# Patient Record
Sex: Male | Born: 1945 | Race: White | Hispanic: No | Marital: Married | Smoking: Former smoker
Health system: Southern US, Community
[De-identification: ages and names within clinical notes are randomized; demographics above are authoritative.]

## PROBLEM LIST (undated history)

## (undated) DIAGNOSIS — R0600 Dyspnea, unspecified: Secondary | ICD-10-CM

## (undated) DIAGNOSIS — I10 Essential (primary) hypertension: Secondary | ICD-10-CM

## (undated) HISTORY — PX: TOTAL KNEE ARTHROPLASTY: SHX125

## (undated) HISTORY — PX: HERNIA REPAIR: SHX51

## (undated) HISTORY — PX: LUMBAR DISC SURGERY: SHX700

## (undated) HISTORY — PX: TOTAL SHOULDER ARTHROPLASTY: SHX126

## (undated) HISTORY — PX: ANKLE FRACTURE SURGERY: SHX122

---

## 2019-07-12 ENCOUNTER — Other Ambulatory Visit: Payer: Self-pay | Admitting: Orthopedic Surgery

## 2019-07-12 DIAGNOSIS — M25511 Pain in right shoulder: Secondary | ICD-10-CM

## 2019-07-20 ENCOUNTER — Other Ambulatory Visit: Payer: Self-pay | Admitting: Orthopedic Surgery

## 2019-07-20 DIAGNOSIS — M25511 Pain in right shoulder: Secondary | ICD-10-CM

## 2019-07-27 ENCOUNTER — Ambulatory Visit
Admission: RE | Admit: 2019-07-27 | Discharge: 2019-07-27 | Disposition: A | Discharge status: Home / Self Care | Payer: Medicare Other | Source: Ambulatory Visit | Attending: Orthopedic Surgery | Admitting: Orthopedic Surgery

## 2019-07-27 DIAGNOSIS — M25511 Pain in right shoulder: Secondary | ICD-10-CM

## 2019-08-03 NOTE — Patient Instructions (Addendum)
DUE TO COVID-19 ONLY ONE VISITOR IS ALLOWED TO COME WITH YOU AND STAY IN THE WAITING ROOM ONLY DURING PRE OP AND PROCEDURE DAY OF SURGERY. THE 1 VISITOR MAY VISIT WITH YOU AFTER SURGERY IN YOUR PRIVATE ROOM DURING VISITING HOURS ONLY!  YOU NEED TO HAVE A COVID 19 TEST ON: 08/09/19 @ 2:30 pm , THIS TEST MUST BE DONE BEFORE SURGERY, COME  [address], [place] , [address].  [facility]) ONCE YOUR COVID TEST IS COMPLETED, PLEASE BEGIN THE QUARANTINE INSTRUCTIONS AS OUTLINED IN YOUR HANDOUT.                Chad Henry   Your procedure is scheduled on: 08/12/19   Report to [facility] Main  Entrance   Report to admitting at: 10:00 AM     Call this number if you have problems the morning of surgery [phone]    Remember:   BRUSH YOUR TEETH MORNING OF SURGERY AND RINSE YOUR MOUTH OUT, NO CHEWING GUM CANDY OR MINTS.     Take these medicines the morning of surgery with A SIP OF WATER: AMLODIPINE,FAMOTIDINE,GABAPENTIN,METOPROLOL.                                 You may not have any metal on your body including hair pins and              piercings  Do not wear jewelry, lotions, powders or perfumes, deodorant             Men may shave face and neck.   Do not bring valuables to the hospital. [facility] IS NOT             RESPONSIBLE   FOR VALUABLES.  Contacts, dentures or bridgework may not be worn into surgery.  Leave suitcase in the car. After surgery it may be brought to your room.     Patients discharged the day of surgery will not be allowed to drive home. IF YOU ARE HAVING SURGERY AND GOING HOME THE SAME DAY, YOU MUST HAVE AN ADULT TO DRIVE YOU HOME AND BE WITH YOU FOR 24 HOURS. YOU MAY GO HOME BY TAXI OR UBER OR ORTHERWISE, BUT AN ADULT MUST ACCOMPANY YOU HOME AND STAY WITH YOU FOR 24 HOURS.  Name and phone number of your driver:  Special Instructions: N/A              Please read over the following fact sheets you were  given: _____________________________________________________________________   NO SOLID FOOD AFTER MIDNIGHT THE NIGHT PRIOR TO SURGERY. NOTHING BY MOUTH EXCEPT CLEAR LIQUIDS UNTIL: 9:00 AM . PLEASE FINISH ENSURE DRINK PER SURGEON ORDER  WHICH NEEDS TO BE COMPLETED AT: 9:00 AM .   CLEAR LIQUID DIET   Foods Allowed                                                                     Foods Excluded  Coffee and tea, regular and decaf                             liquids that you  cannot  Plain Jell-O any favor except red or purple                                           see through such as: Fruit ices (not with fruit pulp)                                     milk, soups, orange juice  Iced Popsicles                                    All solid food Carbonated beverages, regular and diet                                    Cranberry, grape and apple juices Sports drinks like Gatorade Lightly seasoned clear broth or consume(fat free) Sugar, honey syrup  Sample Menu Breakfast                                Lunch                                     Supper Cranberry juice                    Beef broth                            Chicken broth Jell-O                                     Grape juice                           Apple juice Coffee or tea                        Jell-O                                      Popsicle                                                Coffee or tea                        Coffee or tea  _____________________________________________________________________  [facility] - Preparing for Surgery Before surgery, you can play an important role.  Because skin is not sterile, your skin needs to be as free of germs as possible.  You can reduce the number of germs on your skin by washing with CHG (chlorahexidine gluconate) soap before surgery.  CHG is an antiseptic cleaner which kills germs and bonds  with the skin to continue killing germs even after washing. Please DO NOT  use if you have an allergy to CHG or antibacterial soaps.  If your skin becomes reddened/irritated stop using the CHG and inform your nurse when you arrive at Short Stay. Do not shave (including legs and underarms) for at least 48 hours prior to the first CHG shower.  You may shave your face/neck. Please follow these instructions carefully:  1.  Shower with CHG Soap the night before surgery and the  morning of Surgery.  2.  If you choose to wash your hair, wash your hair first as usual with your  normal  shampoo.  3.  After you shampoo, rinse your hair and body thoroughly to remove the  shampoo.                           4.  Use CHG as you would any other liquid soap.  You can apply chg directly  to the skin and wash                       Gently with a scrungie or clean washcloth.  5.  Apply the CHG Soap to your body ONLY FROM THE NECK DOWN.   Do not use on face/ open                           Wound or open sores. Avoid contact with eyes, ears mouth and genitals (private parts).                       Wash face,  Genitals (private parts) with your normal soap.             6.  Wash thoroughly, paying special attention to the area where your surgery  will be performed.  7.  Thoroughly rinse your body with warm water from the neck down.  8.  DO NOT shower/wash with your normal soap after using and rinsing off  the CHG Soap.                9.  Pat yourself dry with a clean towel.            10.  Wear clean pajamas.            11.  Place clean sheets on your bed the night of your first shower and do not  sleep with pets. Day of Surgery : Do not apply any lotions/deodorants the morning of surgery.  Please wear clean clothes to the hospital/surgery center.  FAILURE TO FOLLOW THESE INSTRUCTIONS MAY RESULT IN THE CANCELLATION OF YOUR SURGERY PATIENT SIGNATURE_________________________________  NURSE  SIGNATURE__________________________________  ________________________________________________________________________   Chad Henry  An incentive spirometer is a tool that can help keep your lungs clear and active. This tool measures how well you are filling your lungs with each breath. Taking long deep breaths may help reverse or decrease the chance of developing breathing (pulmonary) problems (especially infection) following:  A long period of time when you are unable to move or be active. BEFORE THE PROCEDURE   If the spirometer includes an indicator to show your best effort, your nurse or respiratory therapist will set it to a desired goal.  If possible, sit up straight or lean slightly forward. Try not to slouch.  Hold the incentive spirometer in an upright position. INSTRUCTIONS  FOR USE  1. Sit on the edge of your bed if possible, or sit up as far as you can in bed or on a chair. 2. Hold the incentive spirometer in an upright position. 3. Breathe out normally. 4. Place the mouthpiece in your mouth and seal your lips tightly around it. 5. Breathe in slowly and as deeply as possible, raising the piston or the ball toward the top of the column. 6. Hold your breath for 3-5 seconds or for as long as possible. Allow the piston or ball to fall to the bottom of the column. 7. Remove the mouthpiece from your mouth and breathe out normally. 8. Rest for a few seconds and repeat Steps 1 through 7 at least 10 times every 1-2 hours when you are awake. Take your time and take a few normal breaths between deep breaths. 9. The spirometer may include an indicator to show your best effort. Use the indicator as a goal to work toward during each repetition. 10. After each set of 10 deep breaths, practice coughing to be sure your lungs are clear. If you have an incision (the cut made at the time of surgery), support your incision when coughing by placing a pillow or rolled up towels firmly  against it. Once you are able to get out of bed, walk around indoors and cough well. You may stop using the incentive spirometer when instructed by your caregiver.  RISKS AND COMPLICATIONS  Take your time so you do not get dizzy or light-headed.  If you are in pain, you may need to take or ask for pain medication before doing incentive spirometry. It is harder to take a deep breath if you are having pain. AFTER USE  Rest and breathe slowly and easily.  It can be helpful to keep track of a log of your progress. Your caregiver can provide you with a simple table to help with this. If you are using the spirometer at home, follow these instructions: [place] IF:   You are having difficultly using the spirometer.  You have trouble using the spirometer as often as instructed.  Your pain medication is not giving enough relief while using the spirometer.  You develop fever of 100.5 F (38.1 C) or higher. SEEK IMMEDIATE MEDICAL CARE IF:   You cough up bloody sputum that had not been present before.  You develop fever of 102 F (38.9 C) or greater.  You develop worsening pain at or near the incision site. MAKE SURE YOU:   Understand these instructions.  Will watch your condition.  Will get help right away if you are not doing well or get worse. Document Released: 08/19/2006 Document Revised: 07/01/2011 Document Reviewed: 10/20/2006 [facility] Patient Information 2014 [place], [place].   ________________________________________________________________________

## 2019-08-04 ENCOUNTER — Other Ambulatory Visit: Payer: Self-pay

## 2019-08-04 ENCOUNTER — Other Ambulatory Visit (HOSPITAL_COMMUNITY): Payer: Self-pay

## 2019-08-04 ENCOUNTER — Encounter (HOSPITAL_COMMUNITY): Payer: Self-pay

## 2019-08-04 ENCOUNTER — Encounter (HOSPITAL_COMMUNITY)
Admission: RE | Admit: 2019-08-04 | Discharge: 2019-08-04 | Disposition: A | Discharge status: Home / Self Care | Payer: Medicare Other | Source: Ambulatory Visit | Attending: Orthopedic Surgery | Admitting: Orthopedic Surgery

## 2019-08-04 DIAGNOSIS — Z01812 Encounter for preprocedural laboratory examination: Secondary | ICD-10-CM | POA: Diagnosis not present

## 2019-08-04 HISTORY — DX: Dyspnea, unspecified: R06.00

## 2019-08-04 HISTORY — DX: Essential (primary) hypertension: I10

## 2019-08-04 NOTE — Progress Notes (Signed)
PCP - Dr. Jonelle Sports. LOV: 2/20221 Cardiologist - Dr. Hyacinth Meeker. LOV: 05/2019  Chest x-ray -  EKG -  Stress Test -  ECHO -  Cardiac Cath -   Sleep Study - yes CPAP - yes  Fasting Blood Sugar -  Checks Blood Sugar _____ times a day  Blood Thinner Instructions: Aspirin Instructions: Last Dose:  Anesthesia review:   Patient denies shortness of breath, fever, cough and chest pain at PAT appointment   Patient verbalized understanding of instructions that were given to them at the PAT appointment. Patient was also instructed that they will need to review over the PAT instructions again at home before surgery.

## 2019-08-05 ENCOUNTER — Encounter (HOSPITAL_COMMUNITY)
Admission: RE | Admit: 2019-08-05 | Discharge: 2019-08-05 | Disposition: A | Discharge status: Home / Self Care | Payer: Medicare Other | Source: Ambulatory Visit | Attending: Orthopedic Surgery | Admitting: Orthopedic Surgery

## 2019-08-05 DIAGNOSIS — Z01812 Encounter for preprocedural laboratory examination: Secondary | ICD-10-CM | POA: Diagnosis not present

## 2019-08-05 LAB — SURGICAL PCR SCREEN
MRSA, PCR: NEGATIVE
Staphylococcus aureus: NEGATIVE

## 2019-08-05 LAB — BASIC METABOLIC PANEL
Anion gap: 9 (ref 5–15)
BUN: 19 mg/dL (ref 8–23)
CO2: 27 mmol/L (ref 22–32)
Calcium: 9.2 mg/dL (ref 8.9–10.3)
Chloride: 106 mmol/L (ref 98–111)
Creatinine, Ser: 1.67 mg/dL — ABNORMAL HIGH (ref 0.61–1.24)
GFR calc Af Amer: 46 mL/min — ABNORMAL LOW (ref >60)
GFR calc non Af Amer: 40 mL/min — ABNORMAL LOW (ref >60)
Glucose, Bld: 96 mg/dL (ref 70–99)
Potassium: 4.2 mmol/L (ref 3.5–5.1)
Sodium: 142 mmol/L (ref 135–145)

## 2019-08-05 LAB — CBC
HCT: 45.2 % (ref 39.0–52.0)
Hemoglobin: 15.1 g/dL (ref 13.0–17.0)
MCH: 30.4 pg (ref 26.0–34.0)
MCHC: 33.4 g/dL (ref 30.0–36.0)
MCV: 91.1 fL (ref 80.0–100.0)
Platelets: 232 10*3/uL (ref 150–400)
RBC: 4.96 MIL/uL (ref 4.22–5.81)
RDW: 13.3 % (ref 11.5–15.5)
WBC: 8 10*3/uL (ref 4.0–10.5)
nRBC: 0 % (ref 0.0–0.2)

## 2019-08-05 NOTE — Progress Notes (Signed)
Creatinine level: 1.67

## 2019-08-09 ENCOUNTER — Other Ambulatory Visit (HOSPITAL_COMMUNITY)
Admission: RE | Admit: 2019-08-09 | Discharge: 2019-08-09 | Disposition: A | Discharge status: Home / Self Care | Payer: Medicare Other | Source: Ambulatory Visit | Attending: Orthopedic Surgery | Admitting: Orthopedic Surgery

## 2019-08-09 DIAGNOSIS — Z20822 Contact with and (suspected) exposure to covid-19: Secondary | ICD-10-CM | POA: Insufficient documentation

## 2019-08-09 DIAGNOSIS — Z01812 Encounter for preprocedural laboratory examination: Secondary | ICD-10-CM | POA: Insufficient documentation

## 2019-08-09 LAB — SARS CORONAVIRUS 2 (TAT 6-24 HRS): SARS Coronavirus 2: NEGATIVE

## 2019-08-12 ENCOUNTER — Encounter (HOSPITAL_COMMUNITY): Payer: Self-pay | Admitting: Orthopedic Surgery

## 2019-08-12 ENCOUNTER — Ambulatory Visit (HOSPITAL_COMMUNITY): Payer: Medicare Other | Admitting: Anesthesiology

## 2019-08-12 ENCOUNTER — Ambulatory Visit (HOSPITAL_COMMUNITY)
Admission: RE | Admit: 2019-08-12 | Discharge: 2019-08-12 | Disposition: A | Discharge status: Home / Self Care | Payer: Medicare Other | Source: Ambulatory Visit | Attending: Orthopedic Surgery | Admitting: Orthopedic Surgery

## 2019-08-12 ENCOUNTER — Encounter (HOSPITAL_COMMUNITY)
Admission: RE | Disposition: A | Discharge status: Home / Self Care | Payer: Self-pay | Source: Ambulatory Visit | Attending: Orthopedic Surgery

## 2019-08-12 DIAGNOSIS — Z96651 Presence of right artificial knee joint: Secondary | ICD-10-CM | POA: Insufficient documentation

## 2019-08-12 DIAGNOSIS — Z96611 Presence of right artificial shoulder joint: Secondary | ICD-10-CM

## 2019-08-12 DIAGNOSIS — T84218A Breakdown (mechanical) of internal fixation device of other bones, initial encounter: Secondary | ICD-10-CM | POA: Insufficient documentation

## 2019-08-12 DIAGNOSIS — M19011 Primary osteoarthritis, right shoulder: Secondary | ICD-10-CM | POA: Diagnosis not present

## 2019-08-12 DIAGNOSIS — Z87891 Personal history of nicotine dependence: Secondary | ICD-10-CM | POA: Diagnosis not present

## 2019-08-12 DIAGNOSIS — Z7982 Long term (current) use of aspirin: Secondary | ICD-10-CM | POA: Diagnosis not present

## 2019-08-12 DIAGNOSIS — Z79899 Other long term (current) drug therapy: Secondary | ICD-10-CM | POA: Diagnosis not present

## 2019-08-12 DIAGNOSIS — Y831 Surgical operation with implant of artificial internal device as the cause of abnormal reaction of the patient, or of later complication, without mention of misadventure at the time of the procedure: Secondary | ICD-10-CM | POA: Diagnosis not present

## 2019-08-12 DIAGNOSIS — I10 Essential (primary) hypertension: Secondary | ICD-10-CM | POA: Insufficient documentation

## 2019-08-12 HISTORY — PX: REVERSE SHOULDER ARTHROPLASTY: SHX5054

## 2019-08-12 SURGERY — ARTHROPLASTY, SHOULDER, TOTAL, REVERSE
Anesthesia: General | Site: Shoulder | Laterality: Right

## 2019-08-12 MED ORDER — 0.9 % SODIUM CHLORIDE (POUR BTL) OPTIME
TOPICAL | Status: DC | PRN
  Administered 2019-08-12: 1000 mL

## 2019-08-12 MED ORDER — MIDAZOLAM HCL 2 MG/2ML IJ SOLN
1.0000 mg | INTRAMUSCULAR | Status: DC
  Administered 2019-08-12: 1 mg via INTRAVENOUS
  Filled 2019-08-12: qty 2

## 2019-08-12 MED ORDER — BUPIVACAINE LIPOSOME 1.3 % IJ SUSP
INTRAMUSCULAR | Status: DC | PRN
  Administered 2019-08-12: 133 mg via PERINEURAL

## 2019-08-12 MED ORDER — VANCOMYCIN HCL 1000 MG IV SOLR
INTRAVENOUS | Status: DC | PRN
  Administered 2019-08-12: 1000 mg

## 2019-08-12 MED ORDER — TRANEXAMIC ACID-NACL 1000-0.7 MG/100ML-% IV SOLN
1000.0000 mg | INTRAVENOUS | Status: AC
  Administered 2019-08-12: 1000 mg via INTRAVENOUS
  Filled 2019-08-12: qty 100

## 2019-08-12 MED ORDER — LACTATED RINGERS IV SOLN: INTRAVENOUS | Status: DC

## 2019-08-12 MED ORDER — METHOCARBAMOL 500 MG PO TABS: 500.0000 mg | ORAL_TABLET | Freq: Three times a day (TID) | ORAL | 1 refills | Status: AC | PRN

## 2019-08-12 MED ORDER — VANCOMYCIN HCL 1000 MG IV SOLR
INTRAVENOUS | Status: AC
  Filled 2019-08-12: qty 1000

## 2019-08-12 MED ORDER — CEFAZOLIN SODIUM-DEXTROSE 2-4 GM/100ML-% IV SOLN
2.0000 g | INTRAVENOUS | Status: AC
  Administered 2019-08-12: 2 g via INTRAVENOUS
  Filled 2019-08-12: qty 100

## 2019-08-12 MED ORDER — ACETAMINOPHEN 500 MG PO TABS
1000.0000 mg | ORAL_TABLET | Freq: Once | ORAL | Status: DC
  Filled 2019-08-12: qty 2

## 2019-08-12 MED ORDER — ONDANSETRON HCL 4 MG/2ML IJ SOLN
INTRAMUSCULAR | Status: AC
  Filled 2019-08-12: qty 2

## 2019-08-12 MED ORDER — FENTANYL CITRATE (PF) 100 MCG/2ML IJ SOLN
50.0000 ug | INTRAMUSCULAR | Status: DC
  Administered 2019-08-12: 50 ug via INTRAVENOUS
  Filled 2019-08-12: qty 2

## 2019-08-12 MED ORDER — STERILE WATER FOR IRRIGATION IR SOLN
Status: DC | PRN
  Administered 2019-08-12: 2000 mL

## 2019-08-12 MED ORDER — ROCURONIUM BROMIDE 10 MG/ML (PF) SYRINGE
PREFILLED_SYRINGE | INTRAVENOUS | Status: DC | PRN
  Administered 2019-08-12: 10 mg via INTRAVENOUS
  Administered 2019-08-12: 50 mg via INTRAVENOUS

## 2019-08-12 MED ORDER — EPHEDRINE SULFATE-NACL 50-0.9 MG/10ML-% IV SOSY
PREFILLED_SYRINGE | INTRAVENOUS | Status: DC | PRN
  Administered 2019-08-12 (×2): 10 mg via INTRAVENOUS

## 2019-08-12 MED ORDER — BUPIVACAINE-EPINEPHRINE (PF) 0.5% -1:200000 IJ SOLN
INTRAMUSCULAR | Status: DC | PRN
  Administered 2019-08-12: 15 mL via PERINEURAL

## 2019-08-12 MED ORDER — PHENYLEPHRINE HCL (PRESSORS) 10 MG/ML IV SOLN
INTRAVENOUS | Status: AC
  Filled 2019-08-12: qty 1

## 2019-08-12 MED ORDER — GLYCOPYRROLATE PF 0.2 MG/ML IJ SOSY
PREFILLED_SYRINGE | INTRAMUSCULAR | Status: DC | PRN
  Administered 2019-08-12: .2 mg via INTRAVENOUS

## 2019-08-12 MED ORDER — PHENYLEPHRINE HCL-NACL 10-0.9 MG/250ML-% IV SOLN
INTRAVENOUS | Status: DC | PRN
  Administered 2019-08-12: 40 ug/min via INTRAVENOUS

## 2019-08-12 MED ORDER — ONDANSETRON HCL 4 MG/2ML IJ SOLN: 4.0000 mg | Freq: Once | INTRAMUSCULAR | Status: DC | PRN

## 2019-08-12 MED ORDER — LIDOCAINE 2% (20 MG/ML) 5 ML SYRINGE
INTRAMUSCULAR | Status: DC | PRN
  Administered 2019-08-12: 40 mg via INTRAVENOUS

## 2019-08-12 MED ORDER — DEXAMETHASONE SODIUM PHOSPHATE 10 MG/ML IJ SOLN
INTRAMUSCULAR | Status: DC | PRN
  Administered 2019-08-12: 4 mg via INTRAVENOUS

## 2019-08-12 MED ORDER — ACETAMINOPHEN 500 MG PO TABS
ORAL_TABLET | ORAL | Status: AC
  Filled 2019-08-12: qty 1

## 2019-08-12 MED ORDER — OXYCODONE-ACETAMINOPHEN 5-325 MG PO TABS: 1.0000 | ORAL_TABLET | ORAL | 0 refills | Status: AC | PRN

## 2019-08-12 MED ORDER — ROCURONIUM BROMIDE 10 MG/ML (PF) SYRINGE
PREFILLED_SYRINGE | INTRAVENOUS | Status: AC
  Filled 2019-08-12: qty 10

## 2019-08-12 MED ORDER — NAPROXEN 500 MG PO TABS: 500.0000 mg | ORAL_TABLET | Freq: Two times a day (BID) | ORAL | 1 refills | Status: AC

## 2019-08-12 MED ORDER — ONDANSETRON HCL 4 MG PO TABS: 4.0000 mg | ORAL_TABLET | Freq: Three times a day (TID) | ORAL | 0 refills | Status: AC | PRN

## 2019-08-12 MED ORDER — LIDOCAINE 2% (20 MG/ML) 5 ML SYRINGE
INTRAMUSCULAR | Status: AC
  Filled 2019-08-12: qty 5

## 2019-08-12 MED ORDER — ONDANSETRON HCL 4 MG/2ML IJ SOLN
INTRAMUSCULAR | Status: DC | PRN
  Administered 2019-08-12: 4 mg via INTRAVENOUS

## 2019-08-12 MED ORDER — ACETAMINOPHEN 500 MG PO TABS
1000.0000 mg | ORAL_TABLET | Freq: Once | ORAL | Status: AC
  Administered 2019-08-12: 1000 mg via ORAL

## 2019-08-12 MED ORDER — PROPOFOL 10 MG/ML IV BOLUS
INTRAVENOUS | Status: DC | PRN
  Administered 2019-08-12: 50 mg via INTRAVENOUS
  Administered 2019-08-12: 150 mg via INTRAVENOUS

## 2019-08-12 MED ORDER — PROPOFOL 10 MG/ML IV BOLUS
INTRAVENOUS | Status: AC
  Filled 2019-08-12: qty 20

## 2019-08-12 MED ORDER — SUGAMMADEX SODIUM 200 MG/2ML IV SOLN
INTRAVENOUS | Status: DC | PRN
  Administered 2019-08-12: 200 mg via INTRAVENOUS

## 2019-08-12 MED ORDER — FENTANYL CITRATE (PF) 100 MCG/2ML IJ SOLN: 25.0000 ug | INTRAMUSCULAR | Status: DC | PRN

## 2019-08-12 SURGICAL SUPPLY — 71 items
BAG ZIPLOCK 12X15 (MISCELLANEOUS) ×3 IMPLANT
BLADE SAW SGTL 83.5X18.5 (BLADE) ×3 IMPLANT
COOLER ICEMAN CLASSIC (MISCELLANEOUS) IMPLANT
COVER BACK TABLE 60X90IN (DRAPES) ×3 IMPLANT
COVER SURGICAL LIGHT HANDLE (MISCELLANEOUS) ×3 IMPLANT
COVER WAND RF STERILE (DRAPES) IMPLANT
CUP SUT UNIV REVERS 39 NEU (Shoulder) ×3 IMPLANT
DERMABOND ADVANCED (GAUZE/BANDAGES/DRESSINGS) ×2
DERMABOND ADVANCED .7 DNX12 (GAUZE/BANDAGES/DRESSINGS) ×1 IMPLANT
DRAPE INCISE IOBAN 66X45 STRL (DRAPES) IMPLANT
DRAPE ORTHO SPLIT 77X108 STRL (DRAPES) ×4
DRAPE SHEET LG 3/4 BI-LAMINATE (DRAPES) ×3 IMPLANT
DRAPE SURG 17X11 SM STRL (DRAPES) ×3 IMPLANT
DRAPE SURG ORHT 6 SPLT 77X108 (DRAPES) ×2 IMPLANT
DRAPE U-SHAPE 47X51 STRL (DRAPES) ×3 IMPLANT
DRSG AQUACEL AG ADV 3.5X 6 (GAUZE/BANDAGES/DRESSINGS) ×3 IMPLANT
DRSG AQUACEL AG ADV 3.5X10 (GAUZE/BANDAGES/DRESSINGS) ×3 IMPLANT
DURAPREP 26ML APPLICATOR (WOUND CARE) ×3 IMPLANT
ELECT BLADE TIP CTD 4 INCH (ELECTRODE) ×3 IMPLANT
ELECT REM PT RETURN 15FT ADLT (MISCELLANEOUS) ×3 IMPLANT
FACESHIELD WRAPAROUND (MASK) ×12 IMPLANT
GLENOID UNI REV MOD 24 +2 LAT (Joint) ×3 IMPLANT
GLENOSPHERE 39+4 LAT/24 UNI RV (Joint) ×3 IMPLANT
GLOVE BIO SURGEON STRL SZ7.5 (GLOVE) ×3 IMPLANT
GLOVE BIO SURGEON STRL SZ8 (GLOVE) ×3 IMPLANT
GLOVE SS BIOGEL STRL SZ 7 (GLOVE) ×2 IMPLANT
GLOVE SS BIOGEL STRL SZ 7.5 (GLOVE) ×1 IMPLANT
GLOVE SUPERSENSE BIOGEL SZ 7 (GLOVE) ×4
GLOVE SUPERSENSE BIOGEL SZ 7.5 (GLOVE) ×2
GLOVE SURG SYN 7.0 (GLOVE) IMPLANT
GLOVE SURG SYN 7.5  E (GLOVE)
GLOVE SURG SYN 7.5 E (GLOVE) IMPLANT
GLOVE SURG SYN 8.0 (GLOVE) IMPLANT
GOWN STRL REUS W/TWL LRG LVL3 (GOWN DISPOSABLE) ×6 IMPLANT
INSERT HUMERAL MED 39/ +3 (Shoulder) ×1 IMPLANT
INSERT MEDIUM HUMERAL 39/ +3 (Shoulder) ×2 IMPLANT
KIT BASIN (CUSTOM PROCEDURE TRAY) ×3 IMPLANT
KIT TURNOVER KIT A (KITS) IMPLANT
MANIFOLD NEPTUNE II (INSTRUMENTS) ×3 IMPLANT
NEEDLE TAPERED W/ NITINOL LOOP (MISCELLANEOUS) ×3 IMPLANT
NS IRRIG 1000ML POUR BTL (IV SOLUTION) ×3 IMPLANT
PACK SHOULDER (CUSTOM PROCEDURE TRAY) ×3 IMPLANT
PAD ARMBOARD 7.5X6 YLW CONV (MISCELLANEOUS) ×3 IMPLANT
PAD COLD SHLDR WRAP-ON (PAD) IMPLANT
PIN SET MODULAR GLENOID SYSTEM (PIN) ×3 IMPLANT
RESTRAINT HEAD UNIVERSAL NS (MISCELLANEOUS) ×3 IMPLANT
SCREW CENTRAL MOD 30MM (Screw) ×3 IMPLANT
SCREW PERI LOCK 5.5X16 (Screw) ×3 IMPLANT
SCREW PERI LOCK 5.5X32 (Screw) ×6 IMPLANT
SCREW PERIPHERAL 5.5X20 LOCK (Screw) ×3 IMPLANT
SLING ARM FOAM STRAP LRG (SOFTGOODS) IMPLANT
SLING ARM FOAM STRAP MED (SOFTGOODS) IMPLANT
SLING ARM IMMOBILIZER LRG (SOFTGOODS) ×3 IMPLANT
SPACER REVERSE UNI 39/ +6MM (Shoulder) ×1 IMPLANT
SPACER SHLD UNI REV 39 +6 (Shoulder) ×2 IMPLANT
SPONGE LAP 18X18 RF (DISPOSABLE) IMPLANT
STEM HUMERAL UNIVERS SZ8 (Stem) ×3 IMPLANT
SUCTION FRAZIER HANDLE 12FR (TUBING) ×2
SUCTION TUBE FRAZIER 12FR DISP (TUBING) ×1 IMPLANT
SUT FIBERWIRE #2 38 T-5 BLUE (SUTURE)
SUT MNCRL AB 3-0 PS2 18 (SUTURE) ×3 IMPLANT
SUT MON AB 2-0 CT1 36 (SUTURE) ×6 IMPLANT
SUT VIC AB 1 CT1 36 (SUTURE) ×6 IMPLANT
SUTURE FIBERWR #2 38 T-5 BLUE (SUTURE) IMPLANT
SUTURE TAPE 1.3 40 TPR END (SUTURE) ×2 IMPLANT
SUTURETAPE 1.3 40 TPR END (SUTURE) ×6
TOWEL OR 17X26 10 PK STRL BLUE (TOWEL DISPOSABLE) ×3 IMPLANT
TOWEL OR NON WOVEN STRL DISP B (DISPOSABLE) ×3 IMPLANT
WATER STERILE IRR 1000ML POUR (IV SOLUTION) ×6 IMPLANT
YANKAUER SUCT BULB TIP 10FT TU (MISCELLANEOUS) ×3 IMPLANT
YANKAUER SUCT BULB TIP NO VENT (SUCTIONS) ×3 IMPLANT

## 2019-08-12 NOTE — Anesthesia Preprocedure Evaluation (Addendum)
Anesthesia Evaluation  Patient identified by MRN, date of birth, ID band Patient awake    Reviewed: Allergy & Precautions, NPO status , Patient's Chart, lab work & pertinent test results  History of Anesthesia Complications Negative for: history of anesthetic complications  Airway Mallampati: III  TM Distance: >3 FB Neck ROM: Full    Dental  (+) Dental Advisory Given, Teeth Intact   Pulmonary shortness of breath, neg COPD, neg recent URI, former smoker,    breath sounds clear to auscultation       Cardiovascular hypertension, Pt. on medications (-) angina(-) Past MI and (-) CHF (-) dysrhythmias  Rhythm:Regular Rate:Normal     Neuro/Psych PSYCHIATRIC DISORDERS negative neurological ROS     GI/Hepatic negative GI ROS, Neg liver ROS,   Endo/Other  negative endocrine ROS  Renal/GU Renal InsufficiencyRenal disease     Musculoskeletal  (+) Arthritis ,   Abdominal   Peds  Hematology negative hematology ROS (+)   Anesthesia Other Findings   Reproductive/Obstetrics                            Anesthesia Physical Anesthesia Plan  ASA: II  Anesthesia Plan: General   Post-op Pain Management:  Regional for Post-op pain   Induction: Intravenous  PONV Risk Score and Plan: 2 and Dexamethasone, Ondansetron and Treatment may vary due to age or medical condition  Airway Management Planned: Oral ETT  Additional Equipment:   Intra-op Plan:   Post-operative Plan: Extubation in OR  Informed Consent: I have reviewed the patients History and Physical, chart, labs and discussed the procedure including the risks, benefits and alternatives for the proposed anesthesia with the patient or authorized representative who has indicated his/her understanding and acceptance.     Dental advisory given  Plan Discussed with: CRNA and Surgeon  Anesthesia Plan Comments:        Anesthesia Quick  Evaluation

## 2019-08-12 NOTE — Discharge Instructions (Signed)
 [name], M.D., F.A.A.O.S. Orthopaedic Surgery Specializing in Arthroscopic and Reconstructive Surgery of the Shoulder [phone] [address] - [place], [place] [address] - Fax [phone]   POST-OP TOTAL SHOULDER REPLACEMENT INSTRUCTIONS  1. Follow up in the office for your first post-op appointment 10-14 days from the date of your surgery. If you do not already have a scheduled appointment, our office will contact you to schedule.  2. The bandage over your incision is waterproof. You may begin showering with this dressing on. You may leave this dressing on until first follow up appointment within 2 weeks. We prefer you leave this dressing in place until follow up however after 5-7 days if you are having itching or skin irritation and would like to remove it you may do so. Go slow and tug at the borders gently to break the bond the dressing has with the skin. At this point if there is no drainage it is okay to go without a bandage or you may cover it with a light guaze and tape. You can also expect significant bruising around your shoulder that will drift down your arm and into your chest wall. This is very normal and should resolve over several days.   3. Wear your sling/immobilizer at all times except to perform the exercises below or to occasionally let your arm dangle by your side to stretch your elbow. You also need to sleep in your sling immobilizer until instructed otherwise. It is ok to remove your sling if you are sitting in a controlled environment and allow your arm to rest in a position of comfort by your side or on your lap with pillows to give your neck and skin a break from the sling. You may remove it to allow arm to dangle by side to shower. If you are up walking around and when you go to sleep at night you need to wear it.  4. Range of motion to your elbow, wrist, and hand are encouraged 3-5 times daily. Exercise to your hand and fingers helps to reduce  swelling you may experience.   5. Prescriptions for a pain medication and a muscle relaxant are provided for you. It is recommended that if you are experiencing pain that you pain medication alone is not controlling, add the muscle relaxant along with the pain medication which can give additional pain relief. The first 1-2 days is generally the most severe of your pain and then should gradually decrease. As your pain lessens it is recommended that you decrease your use of the pain medications to an "as needed basis'" only and to always comply with the recommended dosages of the pain medications.  6. Pain medications can produce constipation along with their use. If you experience this, the use of an over the counter stool softener or laxative daily is recommended.   7. For additional questions or concerns, please do not hesitate to call the office. If after hours there is an answering service to forward your concerns to the physician on call.  8.Pain control following an exparel block  To help control your post-operative pain you received a nerve block  performed with Exparel which is a long acting anesthetic (numbing agent) which can provide pain relief and sensations of numbness (and relief of pain) in the operative shoulder and arm for up to 3 days. Sometimes it provides mixed relief, meaning you may still have numbness in certain areas of the arm but can still be able to   move  parts of that arm, hand, and fingers. We recommend that your prescribed pain medications  be used as needed. We do not feel it is necessary to "pre medicate" and "stay ahead" of pain.  Taking narcotic pain medications when you are not having any pain can lead to unnecessary and potentially dangerous side effects.    9. Use the ice machine as much as possible in the first 5-7 days from surgery, then you can wean its use to as needed. The ice typically needs to be replaced every 6 hours, instead of ice you can actually freeze  water bottles to put in the cooler and then fill water around them to avoid having to purchase ice. You can have spare water bottles freezing to allow you to rotate them once they have melted. Try to have a thin shirt or light cloth or towel under the ice wrap to protect your skin.   10.  We recommend that you avoid any dental work or cleaning in the first 3 months following your joint replacement. This is to help minimize the possibility of infection from the bacteria in your mouth that enters your bloodstream during dental work. We also recommend that you take an antibiotic prior to your dental work for the first year after your shoulder replacement to further help reduce that risk. Please simply contact our office for antibiotics to be sent to your pharmacy prior to dental work.  POST-OP EXERCISES  Pendulum Exercises  Perform pendulum exercises while standing and bending at the waist. Support your uninvolved arm on a table or chair and allow your operated arm to hang freely. Make sure to do these exercises passively - not using you shoulder muscles. These exercises can be performed once your nerve block effects have worn off.  Repeat 20 times. Do 3 sessions per day.     

## 2019-08-12 NOTE — Progress Notes (Signed)
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 PMH:: L ankle fx; lumbar surgery; TKA; TSA.    Clinical Impression   Completed education regarding compensatory strategies for ADL within parameters set as below and R UE exercise includng wrist/hand/elbow AROM, pendulums and lapslides. Pt/wife able to return demonstrate. Written information provided and reviewed. Pt to follow up with MD to progress rehab of shoulder.     Follow Up Recommendations  Follow surgeon's recommendation for DC plan and follow-up therapies;Supervision/Assistance - 24 hour(initially)    Equipment Recommendations  None recommended by OT    Recommendations for Other Services       Precautions / Restrictions Precautions Precautions: Shoulder Shoulder Interventions: Shoulder sling/immobilizer;Off for dressing/bathing/exercises Precaution Booklet Issued: Yes (comment) Precaution Comments: See order set; AROM for ADL within parameters per order; pendulums; lap slides; elbow/wrist/hand ROM Restrictions Weight Bearing Restrictions: Yes RUE Weight Bearing: Non weight bearing      Mobility Bed Mobility                  Transfers Overall transfer level: Needs assistance               General transfer comment: unsteady from anesthesia; HHA - wife aware    Balance Overall balance assessment: Needs assistance(states "some balance issues" at baseline)   Sitting balance-Leahy Scale: Good       Standing balance-Leahy Scale: Poor Standing balance comment: HHA reequired                           ADL either performed or assessed with clinical judgement   ADL Overall ADL's : Needs assistance/impaired                                       General ADL Comments: completed education regarding compensatory strategies for ADL. Educated pt on  [place] for ADL within parameters set. Stressed importance of NO internal rotation; No reaching behind the back with RUE to pull up pants. Pt/wife verbalized understanding. Recommend pt use his showerseat for bathing to reduce risk of falls     Vision Baseline Vision/History: Wears glasses       Perception     Praxis      Pertinent Vitals/Pain Pain Assessment: Faces Faces Pain Scale: Hurts a little bit Pain Location: armpit/pulled hair; headache Pain Descriptors / Indicators: Discomfort Pain Intervention(s): Limited activity within patient's tolerance;Repositioned(encouraged use of ice)     Hand Dominance Right   Extremity/Trunk Assessment Upper Extremity Assessment Upper Extremity Assessment: RUE deficits/detail RUE Deficits / Details: block in effect, hand function returning RUE Coordination: decreased fine motor;decreased gross motor   Lower Extremity Assessment Lower Extremity Assessment: Overall WFL for tasks assessed   Cervical / Trunk Assessment Cervical / Trunk Assessment: Normal   Communication Communication Communication: No difficulties   Cognition Arousal/Alertness: Awake/alert Behavior During Therapy: WFL for tasks assessed/performed Overall Cognitive Status: (pt not at baseline due to anesthesia; forgetful but improvin)                                     General Comments       Exercises Exercises: Shoulder;Other exercises Shoulder Exercises Pendulum Exercise:  PROM;Right;5 reps;Seated(dangle; pt unsteady for pendulums; educated/reviewed picture) Elbow Flexion: Right;10 reps;Seated;AAROM Elbow Extension: AAROM;Right;10 reps;Seated Wrist Flexion: AROM;Right;10 reps;Seated Wrist Extension: AROM;Right;10 reps;Seated Digit Composite Flexion: AAROM;Right;10 reps;Seated Composite Extension: AROM;Right;10 reps;Seated Neck Flexion: AROM Neck Extension: AROM Neck Lateral Flexion - Right: AROM Neck Lateral Flexion - Left: AROM Other  Exercises Other Exercises: lap slides - AAROM x 5   Shoulder Instructions Shoulder Instructions Donning/doffing shirt without moving shoulder: Caregiver independent with task Method for sponge bathing under operated UE: Caregiver independent with task Donning/doffing sling/immobilizer: Caregiver independent with task Correct positioning of sling/immobilizer: Supervision/safety;Caregiver independent with task Pendulum exercises (written home exercise program): Supervision/safety;Caregiver independent with task ROM for elbow, wrist and digits of operated UE: Caregiver independent with task Sling wearing schedule (on at all times/off for ADL's): Caregiver independent with task Proper positioning of operated UE when showering: Caregiver independent with task Positioning of UE while sleeping: Caregiver independent with task    Home Living Family/patient expects to be discharged to:: Private residence   Available Help at Discharge: Family;Available 24 hours/day Type of Home: House Home Access: Stairs to enter   Entrance Stairs-Rails: Can reach both Home Layout: Two level;Able to live on main level with bedroom/bathroom     Bathroom Shower/Tub: Producer, television/film/video: Standard Bathroom Accessibility: Yes How Accessible: Accessible via walker Home Equipment: Shower seat - built in          Prior Functioning/Environment Level of Independence: Independent                 OT Problem List: Decreased strength;Decreased range of motion;Impaired balance (sitting and/or standing);Decreased safety awareness;Decreased knowledge of use of DME or AE;Decreased knowledge of precautions;Impaired UE functional use;Pain      OT Treatment/Interventions:      OT Goals(Current goals can be found in the care plan section) Acute Rehab OT Goals Patient Stated Goal: to go home OT Goal Formulation: All assessment and education complete, DC therapy  OT Frequency:     Barriers to D/C:             Co-evaluation              AM-PAC OT "6 Clicks" Daily Activity     Outcome Measure Help from another person eating meals?: A Little Help from another person taking care of personal grooming?: A Little Help from another person toileting, which includes using toliet, bedpan, or urinal?: A Little Help from another person bathing (including washing, rinsing, drying)?: A Little Help from another person to put on and taking off regular upper body clothing?: A Lot Help from another person to put on and taking off regular lower body clothing?: A Little 6 Click Score: 17   End of Session Equipment Utilized During Treatment: Gait belt Nurse Communication: Mobility status;Other (comment)(complained of headache)  Activity Tolerance: Patient tolerated treatment well Patient left: in chair;with call bell/phone within reach;with family/visitor present  OT Visit Diagnosis: Unsteadiness on feet (R26.81);Muscle weakness (generalized) (M62.81);Pain Pain - Right/Left: Right Pain - part of body: Shoulder(head)                Time: 1508-1600 OT Time Calculation (min): 52 min Charges:  OT General Charges $OT Visit: 1 Visit OT Evaluation $OT Eval Low Complexity: 1 Low OT Treatments $Self Care/Home Management : 8-22 mins $Therapeutic Exercise: 8-22 mins  Luisa Dago, OT/L   Acute OT Clinical Specialist Acute Rehabilitation Services Pager [phone] Office [phone]   [facility] 08/12/2019, 5:32 PM

## 2019-08-12 NOTE — Anesthesia Procedure Notes (Signed)
Anesthesia Regional Block: Interscalene brachial plexus block   Pre-Anesthetic Checklist: ,, timeout performed, Correct Patient, Correct Site, Correct Laterality, Correct Procedure, Correct Position, site marked, Risks and benefits discussed,  Surgical consent,  Pre-op evaluation,  At surgeon's request and post-op pain management  Laterality: Right and Upper  Prep: chloraprep       Needles:  Injection technique: Single-shot     Needle Length: 9cm  Needle Gauge: 22     Additional Needles: Arrow StimuQuik ECHO Echogenic Stimulating PNB Needle  Procedures:,,,, ultrasound used (permanent image in chart),,,,  Narrative:  Start time: 08/12/2019 10:01 AM End time: 08/12/2019 10:06 AM Injection made incrementally with aspirations every 5 mL.  Performed by: Personally  Anesthesiologist: Val Eagle, MD

## 2019-08-12 NOTE — Transfer of Care (Signed)
Immediate Anesthesia Transfer of Care Note  Patient: Chad Henry  Procedure(s) Performed: REVERSE SHOULDER ARTHROPLASTY WITH HARDWARE REMOVAL (Right Shoulder)  Patient Location: PACU  Anesthesia Type:GA combined with regional for post-op pain  Level of Consciousness: sedated and patient cooperative  Airway & Oxygen Therapy: Patient Spontanous Breathing and Patient connected to face mask oxygen  Post-op Assessment: Report given to RN and Post -op Vital signs reviewed and stable  Post vital signs: Reviewed and stable  Last Vitals:  Vitals Value Taken Time  BP 157/73 08/12/19 1426  Temp    Pulse 51 08/12/19 1429  Resp 21 08/12/19 1429  SpO2 100 % 08/12/19 1429  Vitals shown include unvalidated device data.  Last Pain:  Vitals:   08/12/19 1021  TempSrc: Oral         Complications: No apparent anesthesia complications

## 2019-08-12 NOTE — Anesthesia Procedure Notes (Signed)
Procedure Name: Intubation Date/Time: 08/12/2019 12:14 PM Performed by: Myna Bright, CRNA Pre-anesthesia Checklist: Patient identified, Emergency Drugs available, Suction available and Patient being monitored Patient Re-evaluated:Patient Re-evaluated prior to induction Oxygen Delivery Method: Circle system utilized Preoxygenation: Pre-oxygenation with 100% oxygen Induction Type: IV induction Ventilation: Mask ventilation without difficulty Laryngoscope Size: Mac and 4 Grade View: Grade I Tube type: Oral Tube size: 7.5 mm Number of attempts: 1 Airway Equipment and Method: Stylet Placement Confirmation: ETT inserted through vocal cords under direct vision,  positive ETCO2 and breath sounds checked- equal and bilateral Secured at: 22 cm Tube secured with: Tape Dental Injury: Teeth and Oropharynx as per pre-operative assessment

## 2019-08-12 NOTE — Anesthesia Postprocedure Evaluation (Signed)
Anesthesia Post Note  Patient: Chad Henry  Procedure(s) Performed: REVERSE SHOULDER ARTHROPLASTY WITH HARDWARE REMOVAL (Right Shoulder)     Patient location during evaluation: PACU Anesthesia Type: General Level of consciousness: awake and alert Pain management: pain level controlled Vital Signs Assessment: post-procedure vital signs reviewed and stable Respiratory status: spontaneous breathing, nonlabored ventilation, respiratory function stable and patient connected to nasal cannula oxygen Cardiovascular status: blood pressure returned to baseline and stable Postop Assessment: no apparent nausea or vomiting Anesthetic complications: no    Last Vitals:  Vitals:   08/12/19 1445 08/12/19 1500  BP: (!) 145/73   Pulse: (!) 52 (!) 49  Resp: 11 12  Temp:  (!) 36.4 C  SpO2: 95% 94%    Last Pain:  Vitals:   08/12/19 1500  TempSrc:   PainSc: 0-No pain                 Kennieth Rad

## 2019-08-12 NOTE — Progress Notes (Signed)
AssistedDr. Moser with right, ultrasound guided, interscalene  block. Side rails up, monitors on throughout procedure. See vital signs in flow sheet. Tolerated Procedure well.  

## 2019-08-12 NOTE — Op Note (Signed)
08/12/2019  2:06 PM  PATIENT:   Chad Henry  74 y.o. 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  Removal of retained hardware right shoulder  2.  Right shoulder reverse arthroplasty utilizing a press-fit size 8 Arthrex stem with a +6 spacer, +3 polyethylene insert, 39/+4 glenosphere on a small/+2 baseplate  SURGEON:  Docie Abramovich, Vania Rea M.D.  ASSISTANTS: Ralene Bathe, PA-C  ANESTHESIA:   General endotracheal and interscalene block with Exparel  EBL: 250 cc  SPECIMEN: None  Drains: None   PATIENT DISPOSITION:  PACU - hemodynamically stable.    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  He now presents with chronic and progressively increasing right shoulder pain with weakness and limitations in mobility with radiographs confirming advanced arthritis with complete loss of joint space.  There is a broken retained screw traversing the glenoid with a fragment anteriorly from what appears to been a prior coracoid transfer procedure versus potentially a bone block procedure.  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  Due to his increasing pain and functional rotations he is brought to the operating room this time for planned right shoulder reverse arthroplasty with hardware removal.  Preoperatively counseled Chad Henry regarding treatment options as well as the potential risks versus benefits thereof.  Possible surgical complications were reviewed including bleeding, infection, neurovascular injury, persistent pain, loss of motion, anesthetic complication, failure of the implant, and possible need for additional surgery.  He understands, and  accepts, and agrees with our planned procedure.  Procedure in detail:  After undergoing routine preop evaluation the patient received prophylactic antibiotics and interscalene block with Exparel was established in the holding area by the anesthesia department.  Placed supine on the operating table and underwent the smooth induction of a general endotracheal anesthesia.  Placed into the beachchair position and appropriately padded and protected.  The right shoulder girdle region was sterilely prepped and draped in standard fashion.  Timeout was called.  We made an anterior approach utilizing the previous anterior incision proximally and then a distal extension was carried more laterally such that this had a "hockey stick" alignment allowing [place] to incorporate the old proximal incision.  Total length approximate 12 cm.  Skin flaps elevated dissection carried deeply were able to then subsequently identify the deltopectoral interval with some varicosities that had reformed across the interval and these were dissected and the interval was then developed from proximal to distal and it did require ligation of the cephalic vein.  We then divided adhesions beneath the deltoid and then medially identified some remnants of the conjoined tendon which we carefully mobilized and ultimately became clear that the remnant of the subscapularis and conjoined tendon were completely adhered to one another with a large fragment of bone from the apparent previous coracoid transfer that was embedded in these tissues and this also incorporated the fractured screw fragment.  With these findings we proceeded with I dissection allowed separation of the subscapularis from its lesser tuberosity insertion and then the medial soft tissues were reflected as a single layer and then dissection carried subperiosteally around the proximal humeral shaft allowing deliver the head through the wound.  Our humeral head resection was then  outlined with  the extra medullary guide and then completed with an oscillating saw.  A metal cap was then placed over the cut proximal humeral surface we then exposed the glenoid with appropriate retractors.  The glenoid had significantly "dished out" due to the erosion.  We then dissected anteriorly exposing the anterior lip of the glenoid allowing visualization where the previously broken screw had entered the glenoid neck region.  I utilized a curette to scrape the anterior glenoid cortical bone such that we could identify the retained screw fragment in this was identified and then used a rondure to remove bone around this area such that we could place a reverse cutting trephine over the screw fragment and this was then utilized and were able to withdraw the retained screw fragment from the glenoid vault.  We then turned our attention to the anterior soft tissues and identified the large bone mass and this was meticulously divided and dissected free using electrocautery and ultimately were able to remove the bone fragment as well as the broken screw fragment.  Once this was completed I then returned attention to the glenoid and performed a circumferential debridement around the margins of the glenoid and once complete visualization was completed a guidepin was directed into the center of the glenoid with an approximate 10 degree inferior tilt and then we prepared the glenoid with the central followed by the peripheral reamer down to a stable subchondral bone and peripheral osteophytes were then removed with a rondure.  The glenoid was then terminally prepared with the central drill and tapped.  Glenoid was then assembled with a 30 mm lag screw and this was then inserted having been coated with vancomycin powder and this was then placed with excellent fit and fixation.  The peripheral locking screws were all then placed with good fixation.  A 39/+4 glenosphere was then impacted onto the baseplate and a central locking screw was  then placed with good fixation.  We then returned our attention to the proximal humerus where the canal was opened and we broached up to a size 8 with excellent fit at approximately 30 degrees retroversion.  A neutral reaming post was then utilized.  A trial implant was placed and this showed good soft tissue balance good motion good stability.  Trial was then removed the final implant was then assembled and this was then impacted after again we placed vancomycin powder into the canal.  Excellent seating was achieved with good fixation.  A series of trial reductions was then completed and ultimately +9 off the stem was felt to give [place] the best soft tissue balance so a +6 spacer and a +3 polywas then applied.  Final reduction was then completed.  Overall motion soft tissue balance instability was mixed to our satisfaction.  The wound was then copiously irrigated.  Final hemostasis was obtained.  The deltopectoral interval was reapproximated with a series of figure-of-eight #1 Vicryl sutures.  2-0 Vicryl used for the subcu layer and intracuticular 3 Monocryl for the skin followed by Dermabond and Aquasol dressing right and was then placed into a sling and the patient was awakened, extubated, and taken to the recovery room in stable addition.  Jenetta Loges, PA-C was utilized as an Environmental consultant throughout this case essential for help with positioning the patient, positioning the extremity, tissue manipulation, implantation of the prosthesis, wound closure, and intraoperative decision-making.  Marin Shutter MD   Contact # [phone]

## 2019-08-12 NOTE — H&P (Signed)
Chad Henry    Chief Complaint: Right shoulder advanced osteoarthritis HPI: The patient is a 74 y.o. male with chronic and progressively increasing right shoulder pain related to advanced arthritis with remote history of an open stabilization procedure, now with increasing pain, restricted mobility, and associated rotator cuff dysfunction.  Past Medical History:  Diagnosis Date  . Dyspnea   . Hypertension     Past Surgical History:  Procedure Laterality Date  . ANKLE FRACTURE SURGERY Left   . HERNIA REPAIR    . LUMBAR DISC SURGERY N/A   . TOTAL KNEE ARTHROPLASTY Right   . TOTAL SHOULDER ARTHROPLASTY Right     History reviewed. No pertinent family history.  Social History:  reports that he has quit smoking. He has never used smokeless tobacco. He reports current alcohol use. He reports that he does not use drugs.   Medications Prior to Admission  Medication Sig Dispense Refill  . amLODipine (NORVASC) 10 MG tablet Take 10 mg by mouth at bedtime. In the evening (on 2 different doses of NORVASC)    . amLODipine (NORVASC) 5 MG tablet Take 5 mg by mouth daily. In the morning (on 2 different doses of NORVASC)    . aspirin EC 81 MG tablet Take 81 mg by mouth daily.    . Cholecalciferol (VITAMIN D3) 1.25 MG (50000 UT) TABS Take 50,000 Units by mouth every 14 (fourteen) days.    . DULoxetine (CYMBALTA) 30 MG capsule Take 30 mg by mouth at bedtime.    . famotidine (PEPCID) 20 MG tablet Take 20 mg by mouth at bedtime.    . gabapentin (NEURONTIN) 300 MG capsule Take 300 mg by mouth daily at 6 PM. (1900)    . losartan (COZAAR) 100 MG tablet Take 100 mg by mouth daily.    . metoprolol succinate (TOPROL-XL) 25 MG 24 hr tablet Take 25 mg by mouth every evening.    . zolpidem (AMBIEN) 10 MG tablet Take 10 mg by mouth at bedtime as needed for sleep.       Physical Exam: Right shoulder demonstrates painful and profoundly restricted mobility.  He does have a well-healed anterior axillary  incision.  Otherwise neurovascular intact.  Examination otherwise as we have documented at his recent office visit.  Plain radiographs as well as CT scan were completed.  Films confirm complete loss of joint space with peripheral osteophyte formation and subchondral sclerosis.  Additionally, there is a broken screw from a previous coracoid transfer procedure with a remnant of the screw traversing the central aspect of the glenoid.  Vitals  Temp:  [97.8 F (36.6 C)] 97.8 F (36.6 C) (04/22 1021) Pulse Rate:  [47] 47 (04/22 1021) Resp:  [12] 12 (04/22 1021) BP: (141)/(78) 141/78 (04/22 1021) SpO2:  [100 %] 100 % (04/22 1021)  Assessment/Plan  Impression: Right shoulder advanced osteoarthritis  Plan of Action: Procedure(s): REVERSE SHOULDER ARTHROPLASTY  Jacquetta Polhamus M Maricela Kawahara 08/12/2019, 11:00 AM Contact # [phone]

## 2019-08-16 ENCOUNTER — Encounter: Payer: Self-pay | Admitting: *Deleted

## 2021-01-02 IMAGING — CT CT SHOULDER*R* W/O CM
3 of 4 series · 15 of 33 positions shown, 18 images · non-contrast
Comparison: None.

CLINICAL DATA: Chronic right shoulder pain and limited range of
motion following remote football injury. Pre-op for shoulder
surgery.

EXAM:
CT OF THE UPPER RIGHT EXTREMITY WITHOUT CONTRAST
TECHNIQUE: Multidetector CT imaging of the right shoulder was performed
according to the standard protocol.

[Series 6: shoulder 2.00 br40 s3 cor soft · coronal · 0.43mm/px · 3 of 121 slices shown]
[im 32/121  bone]
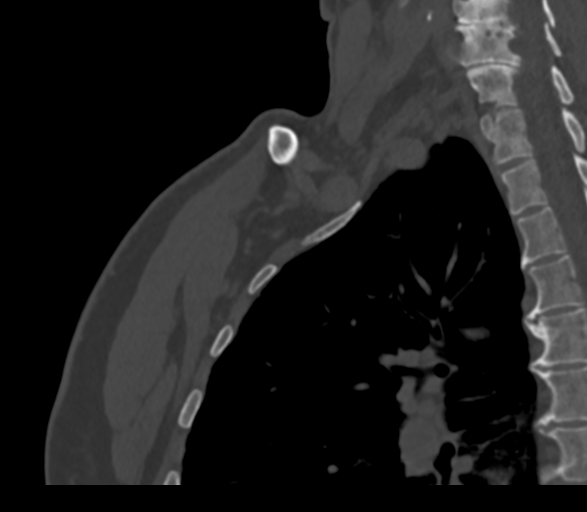
[im 51/121  bone]
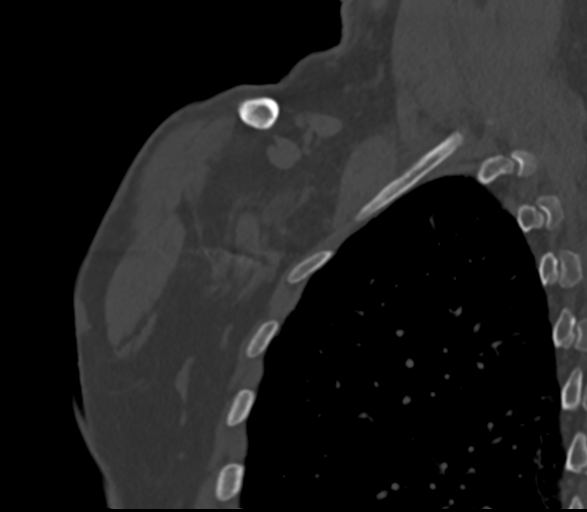
[im 70/121  bone]
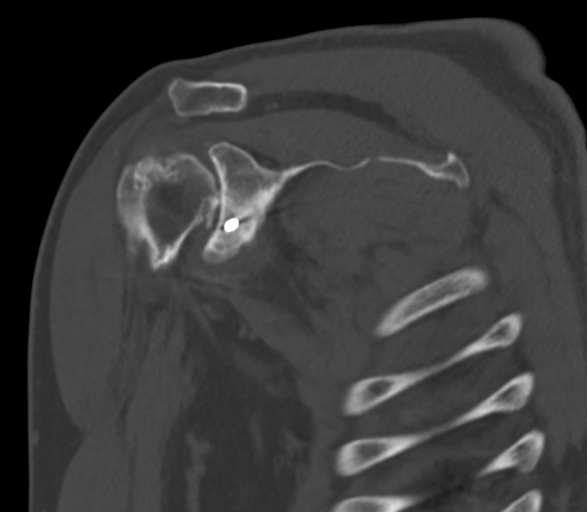

[Series 10: shoulder 2.00 br40 s3 sag soft · sagittal · 0.43mm/px · 5 of 132 slices shown, 6 images]
[im 44/132  bone]
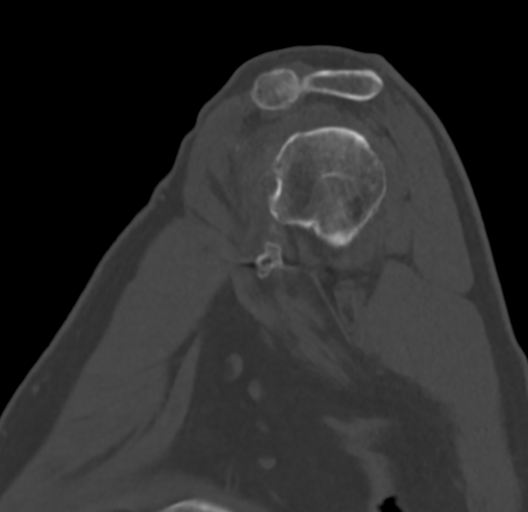
[im 55/132  bone]
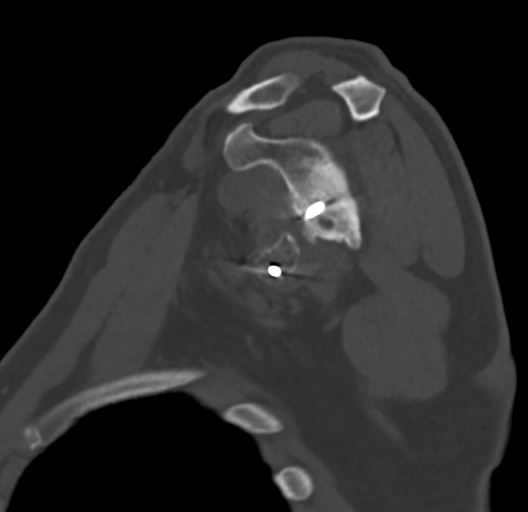
[im 66/132  soft-tissue]
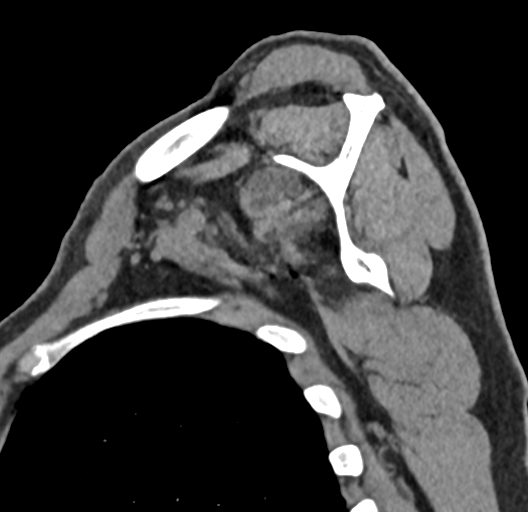
[im 66/132  bone]
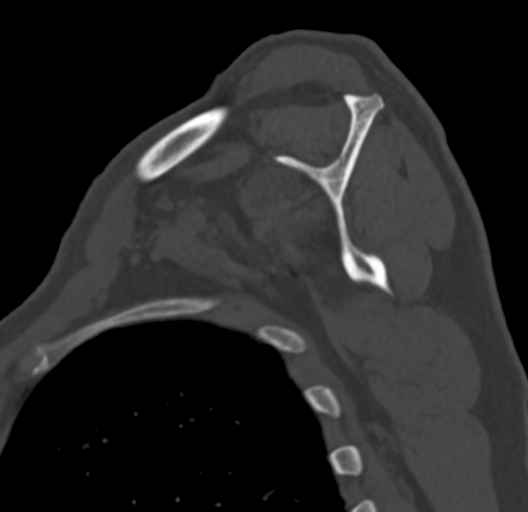
[im 77/132  bone]
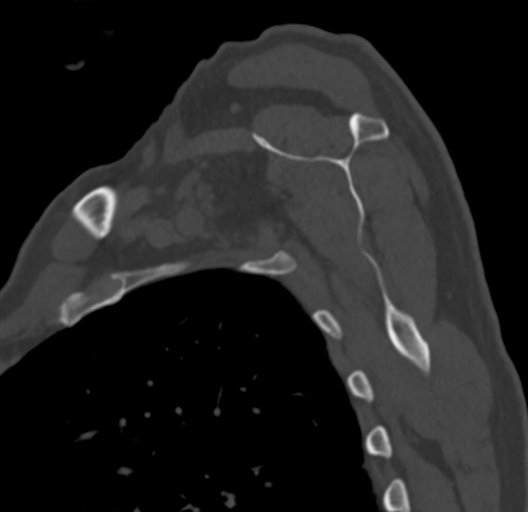
[im 88/132  bone]
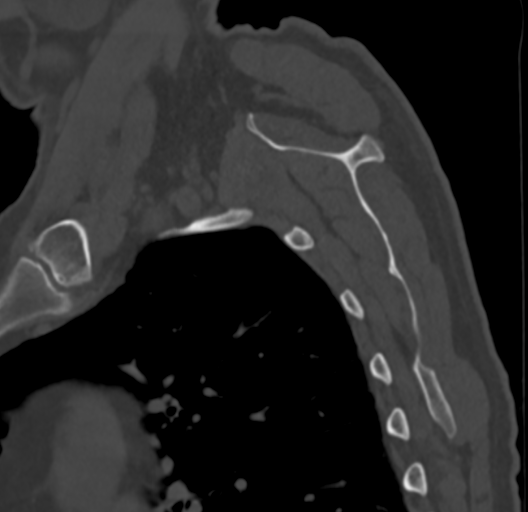

[Series 14: shoulder 0.60 br40 s3 thins soft · axial · 0.55mm/px · z∈[-909,-733]mm · 7 of 368 slices shown, 9 images]
[im 37/368  soft-tissue]
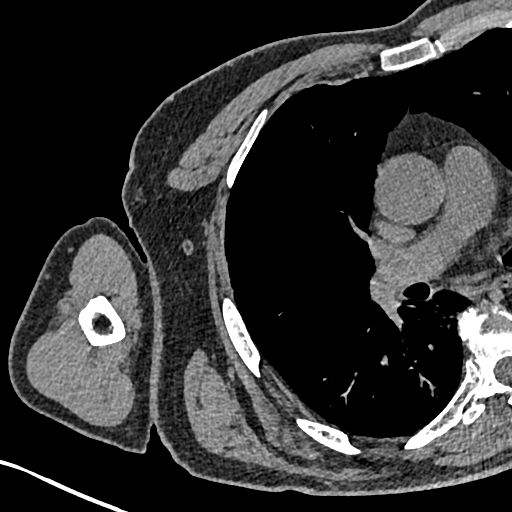
[im 37/368  bone]
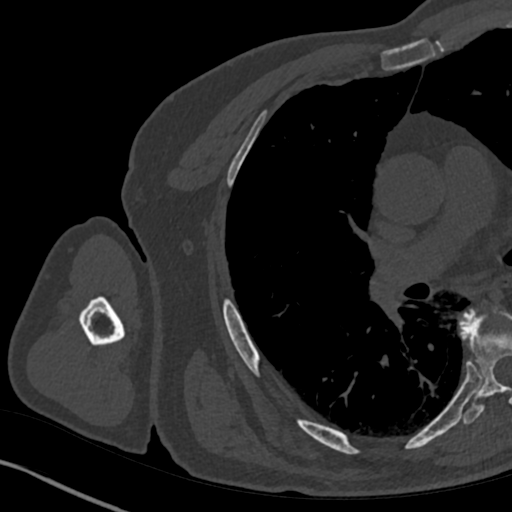
[im 74/368  bone]
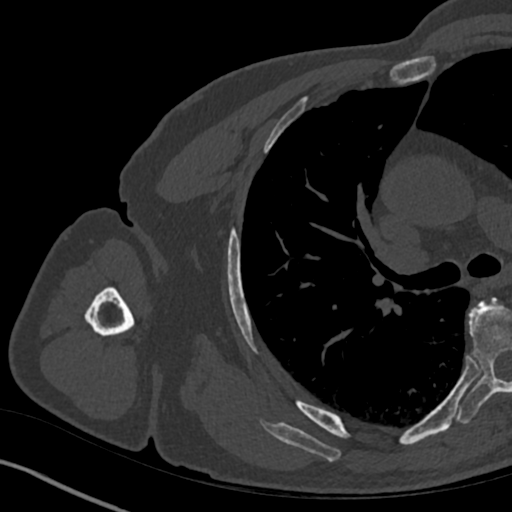
[im 147/368  bone]
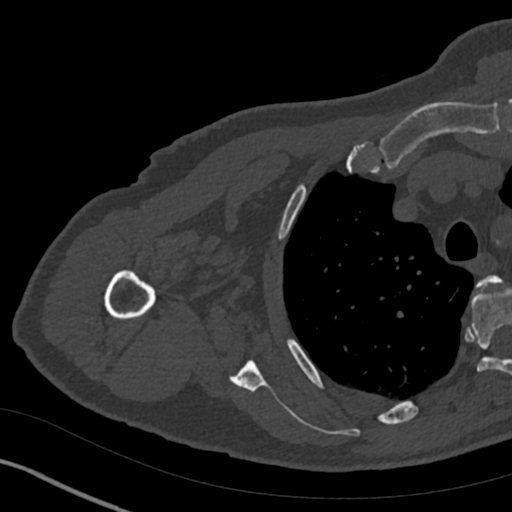
[im 184/368  bone]
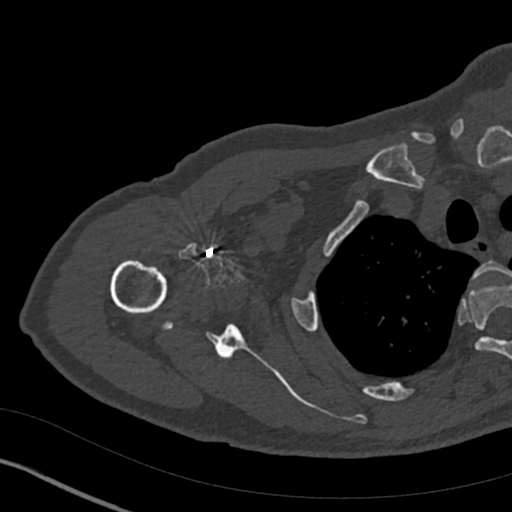
[im 221/368  soft-tissue]
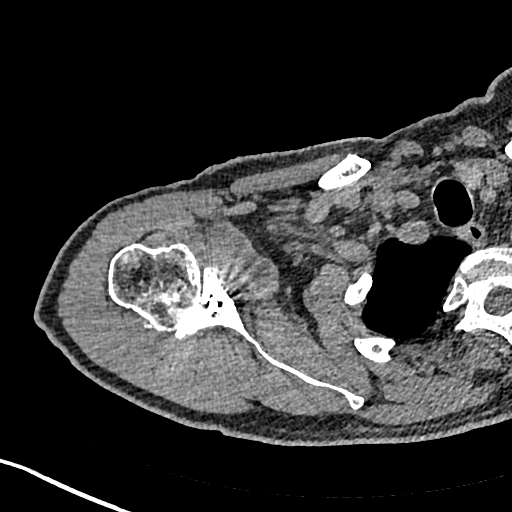
[im 221/368  bone]
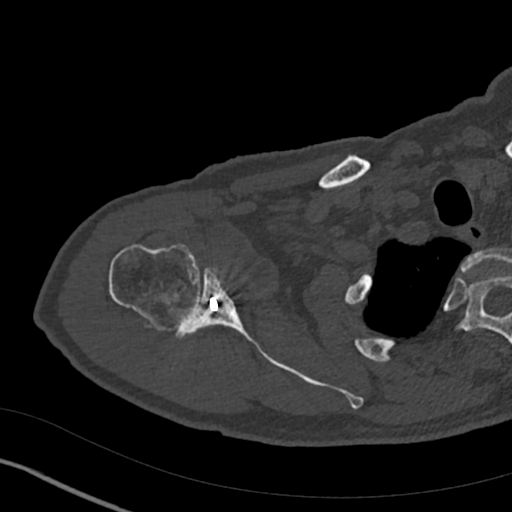
[im 294/368  bone]
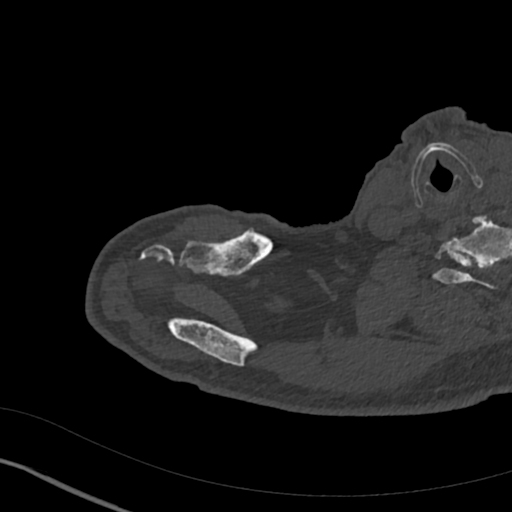
[im 331/368  bone]
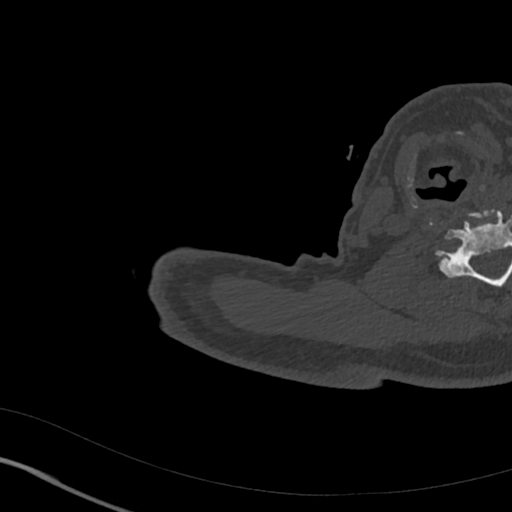

[15 of 33 positions shown; findings below may reference images not displayed]

FINDINGS: Bones/Joint/Cartilage

There are advanced glenohumeral degenerative changes with severe
joint space narrowing, subchondral sclerosis and osteophyte
formation. There are postsurgical changes centrally in the glenoid
with a small screw which is probably fractured. There is an
additional screw fragment within a large loose body anteriorly in
the joint, measuring up to 3.1 cm on image 56/4. There are
additional smaller intra-articular loose bodies and a moderate-sized
joint effusion. No evidence of acute fracture, dislocation or
humeral head avascular necrosis.

Type 2 acromion with mild acromioclavicular degenerative changes.
The right clavicle and visualized chest wall appear unremarkable.

Ligaments

Suboptimally assessed by CT.

Muscles and Tendons

Mild-to-moderate atrophy of the subscapularis muscle. No other
significant rotator cuff muscular atrophy. No significant narrowing
of the subacromial space.

Soft tissues

No focal fluid collection or inflammatory change. Mild emphysematous
changes are present within the visualized lungs with scattered
subpleural reticulation and mucous plugging. 3 mm right upper lobe
nodule on image 55/4 has a nonaggressive appearance and is likely
benign.
IMPRESSION: 1. Advanced glenohumeral degenerative changes with multiple
intra-articular loose bodies and a moderate-sized joint effusion.
2. Postsurgical changes in the glenoid with probable fractured
screw, a portion of it in a large anterior loose body.
3. Mild-to-moderate atrophy of the subscapularis muscle. No other
significant rotator cuff muscular atrophy.
4. Emphysema ([id number]).
